# Patient Record
Sex: Male | Born: 1955 | Race: Black or African American | Hispanic: No | Marital: Single | Smoking: Never smoker
Health system: Southern US, Community
[De-identification: ages and names within clinical notes are randomized; demographics above are authoritative.]

## PROBLEM LIST (undated history)

## (undated) DIAGNOSIS — I10 Essential (primary) hypertension: Secondary | ICD-10-CM

## (undated) HISTORY — PX: COLONOSCOPY: SHX174

---

## 1995-04-16 HISTORY — PX: LUNG SURGERY: SHX703

## 2000-08-04 ENCOUNTER — Encounter: Payer: Self-pay | Admitting: Surgery

## 2000-08-04 ENCOUNTER — Ambulatory Visit (HOSPITAL_COMMUNITY): Admission: RE | Admit: 2000-08-04 | Discharge: 2000-08-04 | Payer: Self-pay | Admitting: Surgery

## 2001-02-10 ENCOUNTER — Ambulatory Visit (HOSPITAL_COMMUNITY): Admission: RE | Admit: 2001-02-10 | Discharge: 2001-02-10 | Payer: Self-pay | Admitting: Surgery

## 2001-02-10 ENCOUNTER — Encounter: Payer: Self-pay | Admitting: Surgery

## 2002-02-25 ENCOUNTER — Encounter: Payer: Self-pay | Admitting: Surgery

## 2002-02-25 ENCOUNTER — Ambulatory Visit (HOSPITAL_COMMUNITY): Admission: RE | Admit: 2002-02-25 | Discharge: 2002-02-25 | Payer: Self-pay | Admitting: Surgery

## 2003-03-02 ENCOUNTER — Ambulatory Visit (HOSPITAL_COMMUNITY): Admission: RE | Admit: 2003-03-02 | Discharge: 2003-03-02 | Payer: Self-pay | Admitting: Surgery

## 2004-03-22 ENCOUNTER — Ambulatory Visit (HOSPITAL_COMMUNITY): Admission: RE | Admit: 2004-03-22 | Discharge: 2004-03-22 | Payer: Self-pay | Admitting: General Surgery

## 2004-04-12 ENCOUNTER — Ambulatory Visit (HOSPITAL_COMMUNITY): Admission: RE | Admit: 2004-04-12 | Discharge: 2004-04-12 | Payer: Self-pay | Admitting: Surgery

## 2005-04-17 ENCOUNTER — Emergency Department (HOSPITAL_COMMUNITY): Admission: EM | Admit: 2005-04-17 | Discharge: 2005-04-17 | Payer: Self-pay | Admitting: Emergency Medicine

## 2007-08-10 IMAGING — CR DG CHEST 2V
2 series · 2 of 2 positions shown · non-contrast
Comparison: none

CLINICAL DATA: Cough for two days, pain on left side intermittently for approximately two weeks.  Hypertension, previous lung surgery 6228.    
 CHEST - 2 VIEW:
 PA and lateral views of the chest are made and are compared to previous studies of 04/12/2004 and show bilateral apical blebs and bullae with bilateral apical or right basilar scarring.  The left bullae now show thickening of the wall compared to the previous study with a questionable small air-fluid level within the most inferior of the left upper lobe bullae.  Heart and mediastinum are normal.  There is no evidence of edema.   No consolidation, pleural effusion or pneumothorax is seen. The bones appear to be within normal limits.

[view not recorded (1 of 2)]
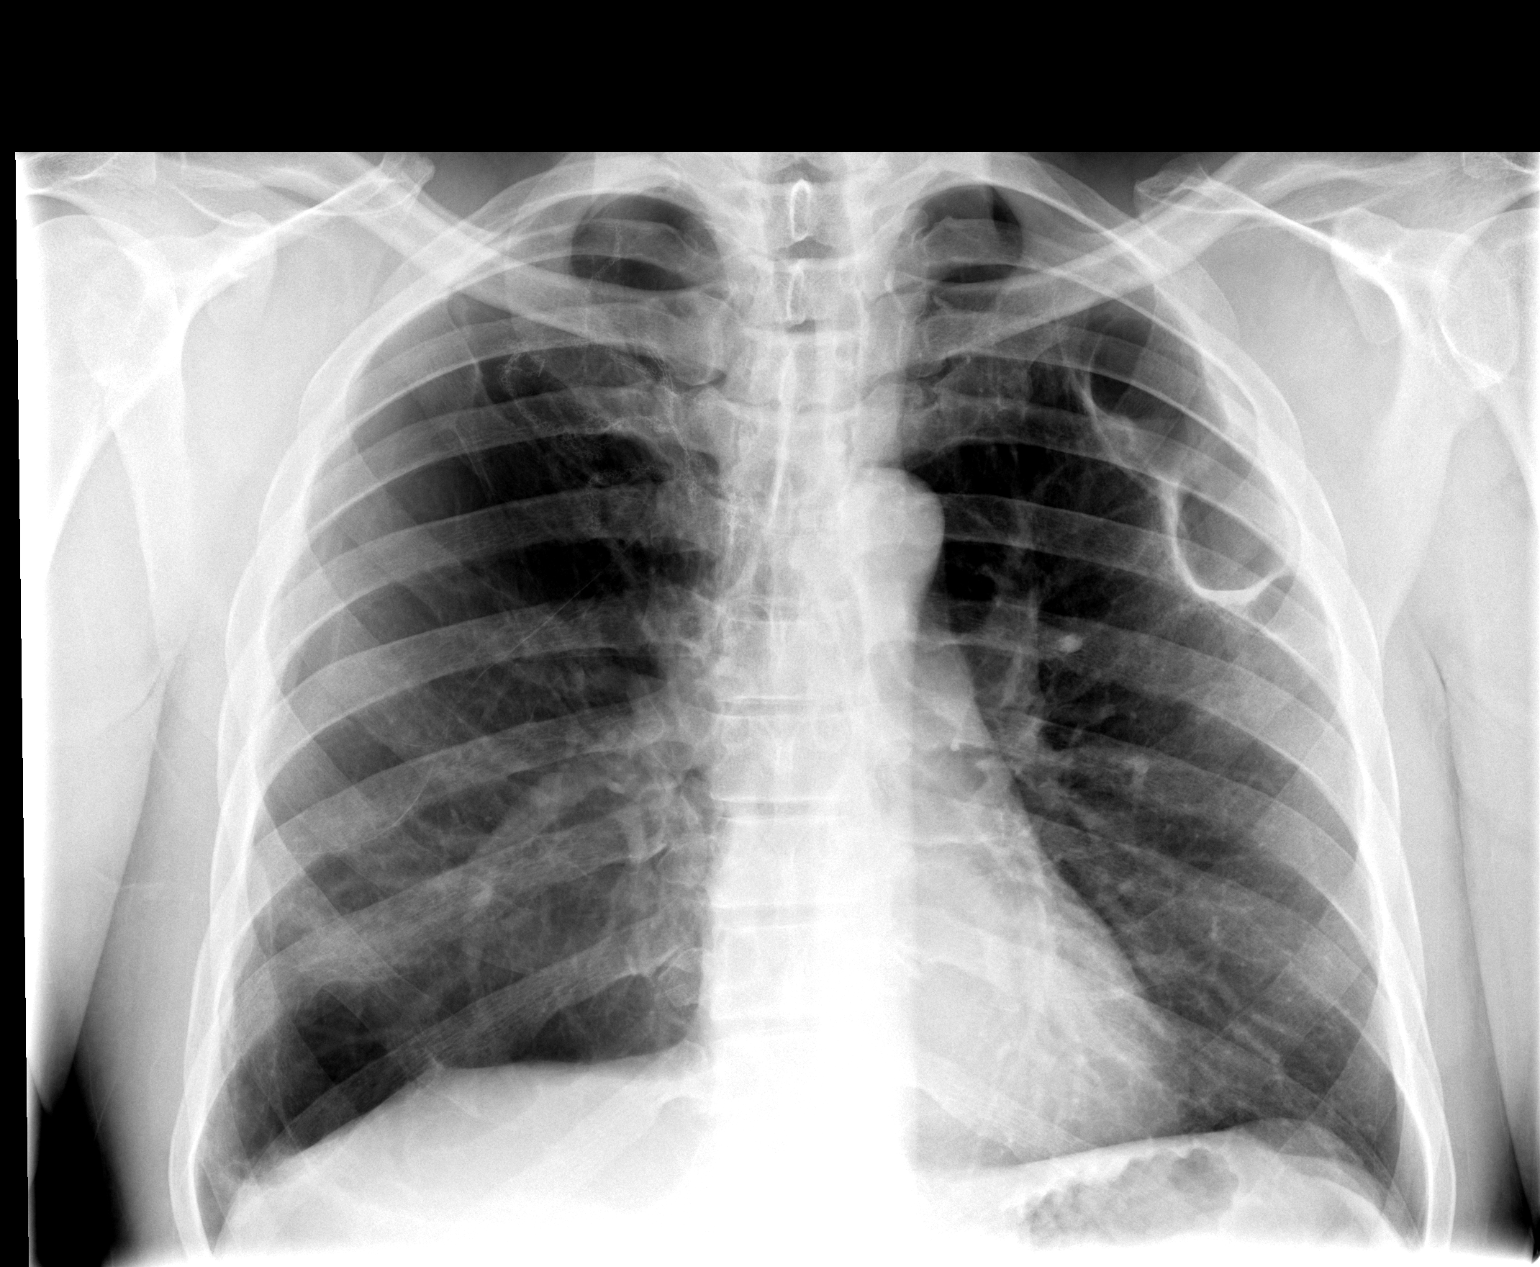

[view not recorded (2 of 2)]
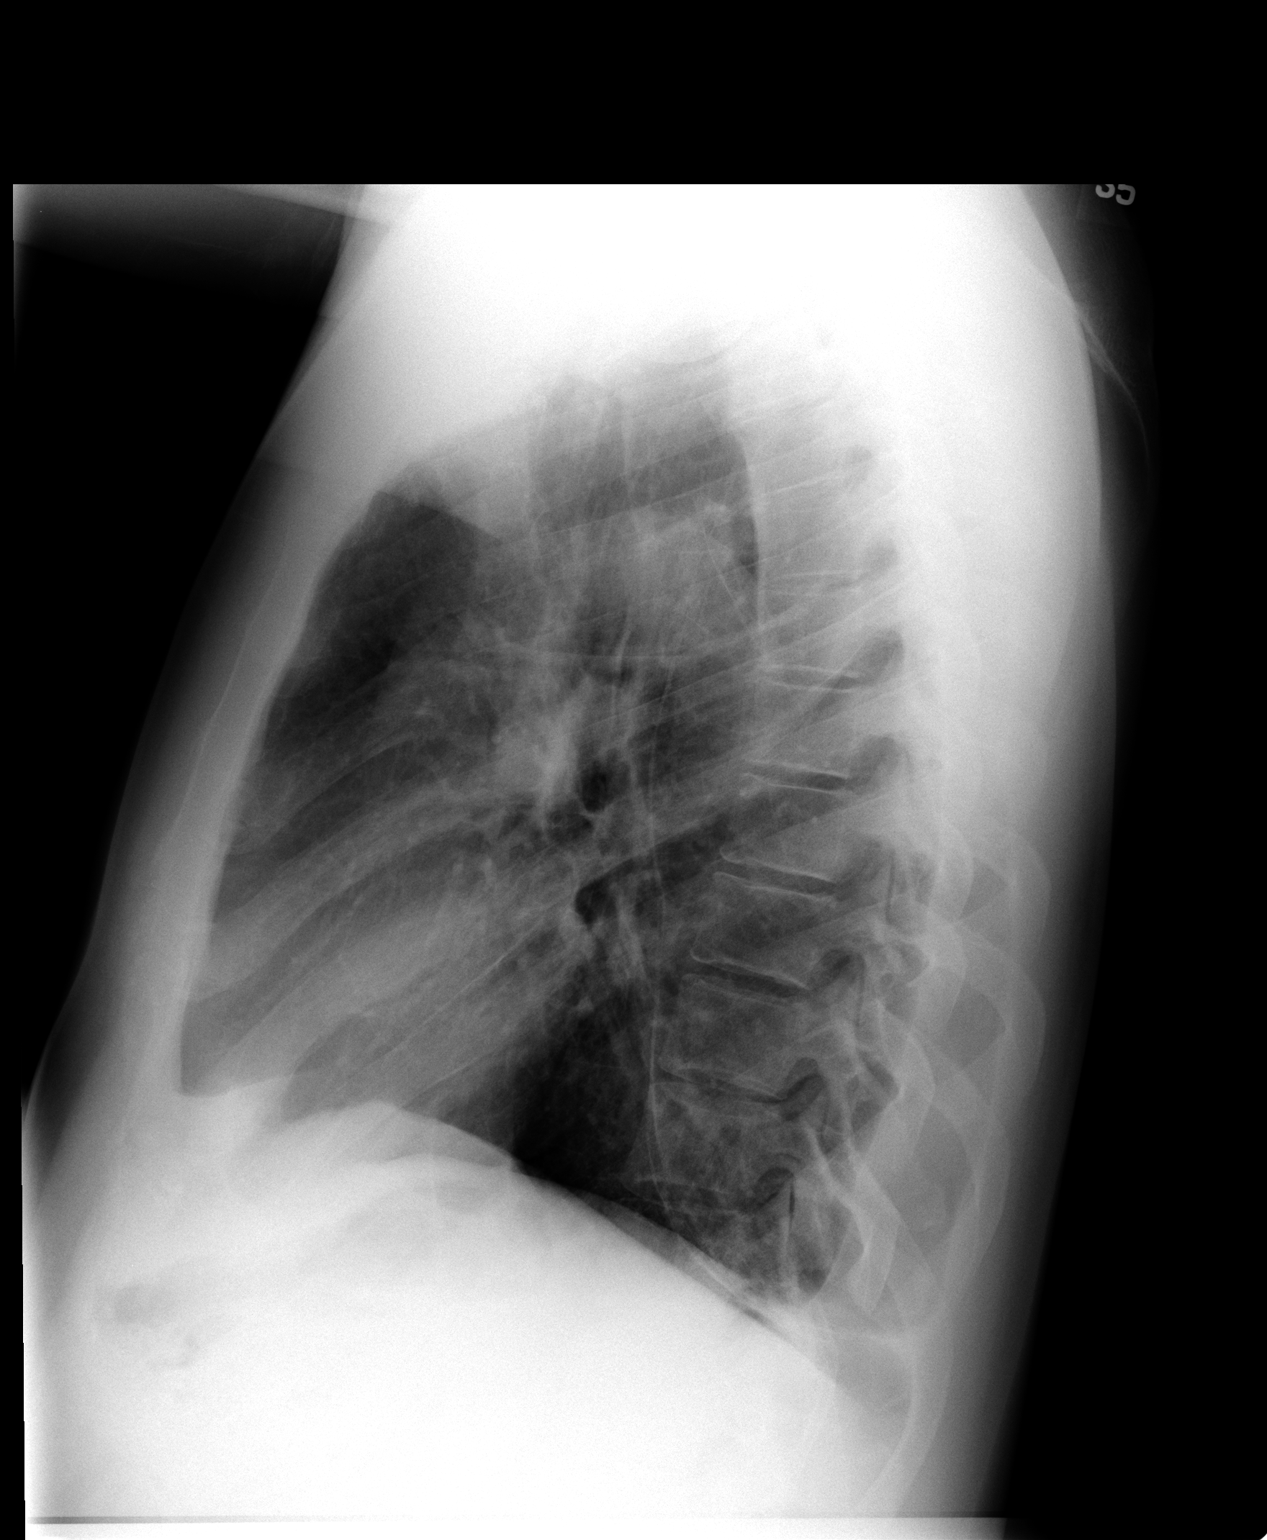

[2 of 2 positions shown; findings below may reference images not displayed]

IMPRESSION: Bilateral apical blebs and bullae with scarring in those areas and in the right lower lobe area.  Questionable small air-fluid level left inferior lateral upper lobe bullae with significant thickening of the wall of the left upper lobe bullae compared to previous study.  The possibility of acute inflammation in that area must be considered clinically.

## 2013-12-06 ENCOUNTER — Telehealth: Payer: Self-pay

## 2013-12-06 ENCOUNTER — Other Ambulatory Visit: Payer: Self-pay

## 2013-12-06 DIAGNOSIS — Z1211 Encounter for screening for malignant neoplasm of colon: Secondary | ICD-10-CM

## 2013-12-06 NOTE — Telephone Encounter (Signed)
Gastroenterology Pre-Procedure Review  Request Date: 12/06/2013 Requesting Physician: Dr. Dwana Melena  PATIENT REVIEW QUESTIONS: The patient responded to the following health history questions as indicated:    1. Diabetes Melitis: no 2. Joint replacements in the past 12 months: no 3. Major health problems in the past 3 months: no 4. Has an artificial valve or MVP: no 5. Has a defibrillator: no 6. Has been advised in past to take antibiotics in advance of a procedure like teeth cleaning: no    MEDICATIONS & ALLERGIES:    Patient reports the following regarding taking any blood thinners:   Plavix? no Aspirin? YES Coumadin? no  Patient confirms/reports the following medications:  Current Outpatient Prescriptions  Medication Sig Dispense Refill  . aspirin 81 MG tablet Take 81 mg by mouth daily.      Marland Kitchen dutasteride (AVODART) 0.5 MG capsule Take 0.5 mg by mouth daily.      . simvastatin (ZOCOR) 40 MG tablet Take 40 mg by mouth daily.      . tamsulosin (FLOMAX) 0.4 MG CAPS capsule Take 0.4 mg by mouth.      . triamterene-hydrochlorothiazide (DYAZIDE) 37.5-25 MG per capsule Take 1 capsule by mouth daily.       No current facility-administered medications for this visit.    Patient confirms/reports the following allergies:  No Known Allergies  No orders of the defined types were placed in this encounter.    AUTHORIZATION INFORMATION Primary Insurance:   ID #:   Group #:  Pre-Cert / Auth required:  Pre-Cert / Auth #:   Secondary Insurance:   ID #:   Group #:  Pre-Cert / Auth required: Pre-Cert / Auth #:   SCHEDULE INFORMATION: Procedure has been scheduled as follows:  Date: 12/29/2013                   Time:  10:30 Am Location: [facility] Short Stay  This Gastroenterology Pre-Precedure Review Form is being routed to the following provider(s): R. Roetta Sessions, MD

## 2013-12-08 NOTE — Telephone Encounter (Signed)
OK to schedule

## 2013-12-09 MED ORDER — PEG-KCL-NACL-NASULF-NA ASC-C 100 G PO SOLR: 1.0000 | ORAL | Status: DC

## 2013-12-09 NOTE — Telephone Encounter (Signed)
Rx sent to the pharmacy and instructions mailed to pt.  

## 2013-12-15 ENCOUNTER — Encounter (HOSPITAL_COMMUNITY): Payer: Self-pay | Admitting: Pharmacy Technician

## 2013-12-29 ENCOUNTER — Encounter (HOSPITAL_COMMUNITY)
Admission: RE | Disposition: A | Discharge status: Home / Self Care | Payer: Self-pay | Source: Ambulatory Visit | Attending: Internal Medicine

## 2013-12-29 ENCOUNTER — Encounter (HOSPITAL_COMMUNITY): Payer: Self-pay

## 2013-12-29 ENCOUNTER — Ambulatory Visit (HOSPITAL_COMMUNITY)
Admission: RE | Admit: 2013-12-29 | Discharge: 2013-12-29 | Disposition: A | Discharge status: Home / Self Care | Payer: BC Managed Care – PPO | Source: Ambulatory Visit | Attending: Internal Medicine | Admitting: Internal Medicine

## 2013-12-29 DIAGNOSIS — Z1211 Encounter for screening for malignant neoplasm of colon: Secondary | ICD-10-CM | POA: Diagnosis present

## 2013-12-29 DIAGNOSIS — K5730 Diverticulosis of large intestine without perforation or abscess without bleeding: Secondary | ICD-10-CM | POA: Diagnosis not present

## 2013-12-29 DIAGNOSIS — I10 Essential (primary) hypertension: Secondary | ICD-10-CM | POA: Insufficient documentation

## 2013-12-29 DIAGNOSIS — Z7982 Long term (current) use of aspirin: Secondary | ICD-10-CM | POA: Diagnosis not present

## 2013-12-29 DIAGNOSIS — Z79899 Other long term (current) drug therapy: Secondary | ICD-10-CM | POA: Insufficient documentation

## 2013-12-29 DIAGNOSIS — K5731 Diverticulosis of large intestine without perforation or abscess with bleeding: Secondary | ICD-10-CM

## 2013-12-29 HISTORY — PX: COLONOSCOPY: SHX5424

## 2013-12-29 HISTORY — DX: Essential (primary) hypertension: I10

## 2013-12-29 SURGERY — COLONOSCOPY
Anesthesia: Moderate Sedation

## 2013-12-29 MED ORDER — MEPERIDINE HCL 100 MG/ML IJ SOLN
INTRAMUSCULAR | Status: DC | PRN
  Administered 2013-12-29: 25 mg via INTRAVENOUS
  Administered 2013-12-29: 50 mg via INTRAVENOUS

## 2013-12-29 MED ORDER — STERILE WATER FOR IRRIGATION IR SOLN
Status: DC | PRN
  Administered 2013-12-29: 11:00:00

## 2013-12-29 MED ORDER — MEPERIDINE HCL 100 MG/ML IJ SOLN
INTRAMUSCULAR | Status: AC
  Filled 2013-12-29: qty 2

## 2013-12-29 MED ORDER — SODIUM CHLORIDE 0.9 % IV SOLN
INTRAVENOUS | Status: DC
  Administered 2013-12-29: 10:00:00 via INTRAVENOUS

## 2013-12-29 MED ORDER — MIDAZOLAM HCL 5 MG/5ML IJ SOLN
INTRAMUSCULAR | Status: AC
  Filled 2013-12-29: qty 10

## 2013-12-29 MED ORDER — ONDANSETRON HCL 4 MG/2ML IJ SOLN
INTRAMUSCULAR | Status: AC
  Filled 2013-12-29: qty 2

## 2013-12-29 MED ORDER — MIDAZOLAM HCL 5 MG/5ML IJ SOLN
INTRAMUSCULAR | Status: DC | PRN
  Administered 2013-12-29: 1 mg via INTRAVENOUS
  Administered 2013-12-29 (×2): 2 mg via INTRAVENOUS

## 2013-12-29 MED ORDER — ONDANSETRON HCL 4 MG/2ML IJ SOLN
INTRAMUSCULAR | Status: DC | PRN
  Administered 2013-12-29: 4 mg via INTRAVENOUS

## 2013-12-29 NOTE — H&P (Signed)
 @   Primary Care Physician:  Catalina Pizza, MD Primary Gastroenterologist:  Dr. Jena Gauss  Pre-Procedure History & Physical: HPI:  Bryce Mcdaniel is a 58 y.o. male is here for a screening colonoscopy. Negative Colonoscopy by Dr. Katrinka Blazing 10 Years Ago. No Bowel Symptoms. No family History of Colon Cancer  Past Medical History  Diagnosis Date  . Hypertension     Past Surgical History  Procedure Laterality Date  . Lung surgery Right 1997    Dr Tanda Rockers [facility]  . Colonoscopy  10 years    Prior to Admission medications   Medication Sig Start Date End Date Taking? Authorizing Provider  aspirin 81 MG tablet Take 81 mg by mouth daily.   Yes Historical Provider, MD  dutasteride (AVODART) 0.5 MG capsule Take 0.5 mg by mouth daily.   Yes Historical Provider, MD  simvastatin (ZOCOR) 40 MG tablet Take 40 mg by mouth daily.   Yes Historical Provider, MD  tamsulosin (FLOMAX) 0.4 MG CAPS capsule Take 0.4 mg by mouth.   Yes Historical Provider, MD  triamterene-hydrochlorothiazide (DYAZIDE) 37.5-25 MG per capsule Take 1 capsule by mouth daily.   Yes Historical Provider, MD    Allergies as of 12/06/2013  . (No Known Allergies)    History reviewed. No pertinent family history.  History   Social History  . Marital Status: Single    Spouse Name: N/A    Number of Children: N/A  . Years of Education: N/A   Occupational History  . Not on file.   Social History Main Topics  . Smoking status: Never Smoker   . Smokeless tobacco: Not on file  . Alcohol Use: Yes  . Drug Use: No  . Sexual Activity: Not on file   Other Topics Concern  . Not on file   Social History Narrative  . No narrative on file    Review of Systems: See HPI, otherwise negative ROS  Physical Exam: BP 129/83  Pulse 72  Temp(Src) 98.1 F (36.7 C) (Oral)  Resp 16  Ht  (1.778 m)  Wt 217 lb (98.431 kg)  BMI 31.14 kg/m2  SpO2 98% General:   Alert,  Well-developed, well-nourished, pleasant and cooperative in  NAD Head:  Normocephalic and atraumatic. Eyes:  Sclera clear, no icterus.   Conjunctiva pink. Ears:  Normal auditory acuity. Nose:  No deformity, discharge,  or lesions. Mouth:  No deformity or lesions, dentition normal. Neck:  Supple; no masses or thyromegaly. Lungs:  Clear throughout to auscultation.   No wheezes, crackles, or rhonchi. No acute distress. Heart:  Regular rate and rhythm; no murmurs, clicks, rubs,  or gallops. Abdomen:  Soft, nontender and nondistended. No masses, hepatosplenomegaly or hernias noted. Normal bowel sounds, without guarding, and without rebound.   Msk:  Symmetrical without gross deformities. Normal posture. Pulses:  Normal pulses noted. Extremities:  Without clubbing or edema. Neurologic:  Alert and  oriented x4;  grossly normal neurologically. Skin:  Intact without significant lesions or rashes. Cervical Nodes:  No significant cervical adenopathy. Psych:  Alert and cooperative. Normal mood and affect.  Impression/Plan: Bryce Mcdaniel is now here to undergo a screening colonoscopy.  Average risk screening examination.  Risks, benefits, limitations, imponderables and alternatives regarding colonoscopy have been reviewed with the patient. Questions have been answered. All parties agreeable.     Notice:  This dictation was prepared with Dragon dictation along with smaller phrase technology. Any transcriptional errors that result from this process are unintentional and may not be corrected  upon review.

## 2013-12-29 NOTE — Discharge Instructions (Addendum)
°Colonoscopy °Discharge Instructions ° °Read the instructions outlined below and refer to this sheet in the next few weeks. These discharge instructions provide you with general information on caring for yourself after you leave the hospital. Your doctor may also give you specific instructions. While your treatment has been planned according to the most current medical practices available, unavoidable complications occasionally occur. If you have any problems or questions after discharge, call Dr. Rourk at [phone]. °ACTIVITY °· You may resume your regular activity, but move at a slower pace for the next 24 hours.  °· Take frequent rest periods for the next 24 hours.  °· Walking will help get rid of the air and reduce the bloated feeling in your belly (abdomen).  °· No driving for 24 hours (because of the medicine (anesthesia) used during the test).   °· Do not sign any important legal documents or operate any machinery for 24 hours (because of the anesthesia used during the test).  °NUTRITION °· Drink plenty of fluids.  °· You may resume your normal diet as instructed by your doctor.  °· Begin with a light meal and progress to your normal diet. Heavy or fried foods are harder to digest and may make you feel sick to your stomach (nauseated).  °· Avoid alcoholic beverages for 24 hours or as instructed.  °MEDICATIONS °· You may resume your normal medications unless your doctor tells you otherwise.  °WHAT YOU CAN EXPECT TODAY °· Some feelings of bloating in the abdomen.  °· Passage of more gas than usual.  °· Spotting of blood in your stool or on the toilet paper.  °IF YOU HAD POLYPS REMOVED DURING THE COLONOSCOPY: °· No aspirin products for 7 days or as instructed.  °· No alcohol for 7 days or as instructed.  °· Eat a soft diet for the next 24 hours.  °FINDING OUT THE RESULTS OF YOUR TEST °Not all test results are available during your visit. If your test results are not back during the visit, make an appointment  with your caregiver to find out the results. Do not assume everything is normal if you have not heard from your caregiver or the medical facility. It is important for you to follow up on all of your test results.  °SEEK IMMEDIATE MEDICAL ATTENTION IF: °· You have more than a spotting of blood in your stool.  °· Your belly is swollen (abdominal distention).  °· You are nauseated or vomiting.  °· You have a temperature over 101.  °· You have abdominal pain or discomfort that is severe or gets worse throughout the day.  ° ° ° ° °Diverticulosis information provided ° °Recommend one more screening colonoscopy in 10 years ° °Diverticulosis °Diverticulosis is the condition that develops when small pouches (diverticula) form in the wall of your colon. Your colon, or large intestine, is where water is absorbed and stool is formed. The pouches form when the inside layer of your colon pushes through weak spots in the outer layers of your colon. °CAUSES  °No one knows exactly what causes diverticulosis. °RISK FACTORS °· Being older than 50. Your risk for this condition increases with age. Diverticulosis is rare in people younger than 40 years. By age 80, almost everyone has it. °· Eating a low-fiber diet. °· Being frequently constipated. °· Being overweight. °· Not getting enough exercise. °· Smoking. °· Taking over-the-counter pain medicines, like aspirin and ibuprofen. °SYMPTOMS  °Most people with diverticulosis do not have symptoms. °DIAGNOSIS  °Because diverticulosis   often has no symptoms, health care providers often discover the condition during an exam for other colon problems. In many cases, a health care provider will diagnose diverticulosis while using a flexible scope to examine the colon (colonoscopy). °TREATMENT  °If you have never developed an infection related to diverticulosis, you may not need treatment. If you have had an infection before, treatment may include: °· Eating more fruits, vegetables, and  grains. °· Taking a fiber supplement. °· Taking a live bacteria supplement (probiotic). °· Taking medicine to relax your colon. °HOME CARE INSTRUCTIONS  °· Drink at least 6-8 glasses of water each day to prevent constipation. °· Try not to strain when you have a bowel movement. °· Keep all follow-up appointments. °If you have had an infection before:  °· Increase the fiber in your diet as directed by your health care provider or dietitian. °· Take a dietary fiber supplement if your health care provider approves. °· Only take medicines as directed by your health care provider. °SEEK MEDICAL CARE IF:  °· You have abdominal pain. °· You have bloating. °· You have cramps. °· You have not gone to the bathroom in 3 days. °SEEK IMMEDIATE MEDICAL CARE IF:  °· Your pain gets worse. °· Your bloating becomes very bad. °· You have a fever or chills, and your symptoms suddenly get worse. °· You begin vomiting. °· You have bowel movements that are bloody or black. °MAKE SURE YOU: °· Understand these instructions. °· Will watch your condition. °· Will get help right away if you are not doing well or get worse. °Document Released: 12/28/2003 Document Revised: 04/06/2013 Document Reviewed: 02/24/2013 °[employer]® Patient Information ©2015 [employer]. This information is not intended to replace advice given to you by your health care provider. Make sure you discuss any questions you have with your health care provider. ° °

## 2013-12-29 NOTE — Op Note (Signed)
[facility] [address] [place], [address]   COLONOSCOPY PROCEDURE REPORT  PATIENT: Bryce Mcdaniel, Bryce Mcdaniel  MR#:         [id number] BIRTHDATE: 01/05/1956 , 58  yrs. old GENDER: Male ENDOSCOPIST: R.  Roetta Sessions, MD FACP FACG REFERRED BY:  Catalina Pizza, M.D. PROCEDURE DATE:  12/29/2013 PROCEDURE:     Colonoscopy-screening  INDICATIONS: Average risk colorectal cancer screening examination  INFORMED CONSENT:  The risks, benefits, alternatives and imponderables including but not limited to bleeding, perforation as well as the possibility of a missed lesion have been reviewed.  The potential for biopsy, lesion removal, etc. have also been discussed.  Questions have been answered.  All parties agreeable. Please see the history and physical in the medical record for more information.  MEDICATIONS: Versed 5 mg IV and Demerol 75 mg IV in divided doses. Zofran 4 mg IV.  DESCRIPTION OF PROCEDURE:  After a digital rectal exam was performed, the EC-[id number] ([id number])  colonoscope was advanced from the anus through the rectum and colon to the area of the cecum, ileocecal valve and appendiceal orifice.  The cecum was deeply intubated.  These structures were well-seen and photographed for the record.  From the level of the cecum and ileocecal valve, the scope was slowly and cautiously withdrawn.  The mucosal surfaces were carefully surveyed utilizing scope tip deflection to facilitate fold flattening as needed.  The scope was pulled down into the rectum where a thorough examination including retroflexion was performed.    FINDINGS:  Adequate preparation. Normal rectum. Scattered left-sided diverticula; the remainder of the colonic mucosa appeared normal.  THERAPEUTIC / DIAGNOSTIC MANEUVERS PERFORMED:  none  COMPLICATIONS: none  CECAL WITHDRAWAL TIME:  7 minute  IMPRESSION:  Colonic diverticulosis  RECOMMENDATIONS: One more screening colonoscopy in 10  years   _______________________________ eSigned:  R. Roetta Sessions, MD FACP [facility] 12/29/2013 11:12 AM   CC:

## 2013-12-31 ENCOUNTER — Encounter (HOSPITAL_COMMUNITY): Payer: Self-pay | Admitting: Internal Medicine

## 2015-12-13 ENCOUNTER — Ambulatory Visit (INDEPENDENT_AMBULATORY_CARE_PROVIDER_SITE_OTHER): Payer: BLUE CROSS/BLUE SHIELD | Admitting: Physician Assistant

## 2015-12-13 VITALS — BP 140/78 | HR 67 | Temp 98.6°F | Resp 16 | Ht 70.0 in | Wt 222.6 lb

## 2015-12-13 DIAGNOSIS — M545 Low back pain: Secondary | ICD-10-CM | POA: Diagnosis not present

## 2015-12-13 DIAGNOSIS — M62838 Other muscle spasm: Secondary | ICD-10-CM | POA: Diagnosis not present

## 2015-12-13 MED ORDER — CYCLOBENZAPRINE HCL 5 MG PO TABS: 5.0000 mg | ORAL_TABLET | Freq: Three times a day (TID) | ORAL | 0 refills | Status: AC | PRN

## 2015-12-13 MED ORDER — NAPROXEN 500 MG PO TABS: 500.0000 mg | ORAL_TABLET | Freq: Two times a day (BID) | ORAL | 0 refills | Status: AC

## 2015-12-13 NOTE — Progress Notes (Signed)
Bryce HammedMarcus B Learn  MRN: [id number] DOB: 1956-03-30  Subjective:  Bryce Mcdaniel is a [age] y.o. male seen in office today for a chief complaint of left lower back pain x 2 weeks. 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 Later that day he felt a pain in his left lower back similar to the times he has pulled a muscle. Has associated intermittent  left thigh pain. Denies numbness, tingling, bowel incontinence, and loss of sensation. Notes the leg pain developed immediately after back pain. He has tried wearing a back brace with moderate relief of left back and leg pain. Has also tried intermittent bengay and tylenol with moderate relief. Notes the pain has gotten much better since the initial event but is still present with certain movements.   Review of Systems  Musculoskeletal: Negative for gait problem, joint swelling and myalgias.  Neurological: Negative for dizziness and weakness.    There are no active problems to display for this patient.   Current Outpatient Prescriptions on File Prior to Visit  Medication Sig Dispense Refill  . aspirin 81 MG tablet Take 81 mg by mouth daily.    Marland Kitchen. dutasteride (AVODART) 0.5 MG capsule Take 0.5 mg by mouth daily.    . simvastatin (ZOCOR) 40 MG tablet Take 40 mg by mouth daily.    . tamsulosin (FLOMAX) 0.4 MG CAPS capsule Take 0.4 mg by mouth.    . triamterene-hydrochlorothiazide (DYAZIDE) 37.5-25 MG per capsule Take 1 capsule by mouth daily.     No current facility-administered medications on file prior to visit.     No Known Allergies  Social History   Social History  . Marital status: Single    Spouse name: N/A  . Number of children: N/A  . Years of education: N/A   Occupational History  . Not on file.   Social History Main Topics  . Smoking status: Never Smoker  . Smokeless tobacco: Never Used  . Alcohol use Yes  . Drug use: No  . Sexual activity: Not on file   Other Topics Concern    . Not on file   Social History Narrative  . No narrative on file    Objective:  BP 140/78 (BP Location: Right Arm, Patient Position: Sitting, Cuff Size: Normal)   Pulse 67   Temp 98.6 F (37 C)   Resp 16   Ht 5\' 10"  (1.778 m)   Wt 222 lb 9.6 oz (101 kg)   SpO2 97%   BMI 31.94 kg/m   Physical Exam  Constitutional: He is oriented to person, place, and time and well-developed, well-nourished, and in no distress.  HENT:  Head: Normocephalic and atraumatic.  Eyes: Conjunctivae are normal.  Neck: Normal range of motion.  Pulmonary/Chest: Effort normal.  Musculoskeletal:       Lumbar back: He exhibits tenderness. He exhibits normal range of motion, no bony tenderness and no swelling.       Back:       Left upper leg: He exhibits no tenderness, no bony tenderness and no swelling.  Neurological: He is alert and oriented to person, place, and time. He has normal sensation and normal strength. Gait normal.  Skin: Skin is warm and dry.  Psychiatric: Affect normal.  Vitals reviewed.   Assessment and Plan :   1. Left low back pain, with sciatica presence unspecified -Likely muscle sprain - naproxen (NAPROSYN) 500 MG tablet; Take 1 tablet (  500 mg total) by mouth 2 (two) times daily with a meal.  Dispense: 30 tablet; Refill: 0 -Heat to affected area 4-5 x day 30 min   2. Muscle spasm of left lower extremity -Pt history consistent with muscle spasm of left thigh -Encouraged to increase water intake to 64 oz a day - cyclobenzaprine (FLEXERIL) 5 MG tablet; Take 1 tablet (5 mg total) by mouth 3 (three) times daily as needed for muscle spasms.  Dispense: 60 tablet; Refill: 0  -Return in 7 days if no improvement for further evaluaion  Benjiman Core PA-C  [facility] 12/13/2015 4:13 PM

## 2015-12-13 NOTE — Patient Instructions (Addendum)
Take naproxen twice daily with food for one week for inflammation Take flexeril up to three times a day as needed for spasm Return here in 7 days if no improvement   Just to know, flexeril can cause side effects that may impair your thinking or reactions. Be careful if you drive or do anything that requires you to be awake and alert. void drinking alcohol, which can increase some of the side effects of Flexeril.  NSAIDs like naproxen have common side effects of heartburn, stomach pain, indigestion, and headache. Could lead to renal insufficiency, stroke, or GI bleed if taken excess amounts outside of what is recommended on label long term.    You should avoid heavy lifting or strenuous repetitive activity to prevent recurrence of event. Experiment with both ice and heat and choose whichever feels best for you.  Use heat pad or ice pack, do not apply directly to skin, use barrier such as towel over the skin. Leave on for 15-20 minutes, 3-4 times a day.  Please perform exercises below once your pain goes away. Stretches are to be performed for 2 sets, holding 10-15 seconds each. Recommended to perform this rehab twice daily within pain tolerance for 2 weeks.   FLEXION RANGE OF MOTION AND STRETCHING EXERCISES: STRETCH - Flexion, Single Knee to Chest   Lie on a firm bed or floor with both legs extended in front of you.  Keeping one leg in contact with the floor, bring your opposite knee to your chest. Hold your leg in place by either grabbing behind your thigh or at your knee.  Pull until you feel a gentle stretch in your lower back.   Slowly release your grasp and repeat the exercise with the opposite side.  STRETCH - Flexion, Double Knee to Chest   Lie on a firm bed or floor with both legs extended in front of you.  Keeping one leg in contact with the floor, bring your opposite knee to your chest.  Tense your stomach muscles to support your back and then lift your other knee to your  chest. Hold your legs in place by either grabbing behind your thighs or at your knees.  Pull both knees toward your chest until you feel a gentle stretch in your lower back.   Tense your stomach muscles and slowly return one leg at a time to the floor.  STRETCH - Low Trunk Rotation  Lie on a firm bed or floor. Keeping your legs in front of you, bend your knees so they are both pointed toward the ceiling and your feet are flat on the floor.  Extend your arms out to the side. This will stabilize your upper body by keeping your shoulders in contact with the floor.  Gently and slowly drop both knees together to one side until you feel a gentle stretch in your lower back.   Tense your stomach muscles to support your lower back as you bring your knees back to the starting position. Repeat the exercise to the other side.   EXTENSION RANGE OF MOTION AND FLEXIBILITY EXERCISES: STRETCH - Extension, Prone on Elbows   Lie on your stomach on the floor, a bed will be too soft. Place your palms about shoulder width apart and at the height of your head.  Place your elbows under your shoulders. If this is too painful, stack pillows under your chest.  Allow your body to relax so that your hips drop lower and make contact more completely with the  floor.  Slowly return to lying flat on the floor.  RANGE OF MOTION - Extension, Prone Press Ups  Lie on your stomach on the floor, a bed will be too soft. Place your palms about shoulder width apart and at the height of your head.  Keeping your back as relaxed as possible, slowly straighten your elbows while keeping your hips on the floor. You may adjust the placement of your hands to maximize your comfort. As you gain motion, your hands will come more underneath your shoulders.  Slowly return to lying flat on the floor.  RANGE OF MOTION- Quadruped, Neutral Spine   Assume a hands and knees position on a firm surface. Keep your hands under your shoulders  and your knees under your hips. You may place padding under your knees for comfort.  Drop your head and point your tail bone toward the ground below you. This will round out your lower back like an angry cat.    Slowly lift your head and release your tail bone so that your back sags into a large arch, like an old horse.  Repeat this until you feel limber in your lower back.  Now, find your "sweet spot." This will be the most comfortable position somewhere between the two previous positions. This is your neutral spine. Once you have found this position, tense your stomach muscles to support your lower back.  STRENGTHENING EXERCISES - Low Back Strain These exercises may help you when beginning to rehabilitate your injury. These exercises should be done near your "sweet spot." This is the neutral, low-back arch, somewhere between fully rounded and fully arched, that is your least painful position. When performed in this safe range of motion, these exercises can be used for people who have either a flexion or extension based injury. These exercises may resolve your symptoms with or without further involvement from your physician, physical therapist or athletic trainer. While completing these exercises, remember:   Muscles can gain both the endurance and the strength needed for everyday activities through controlled exercises.  Complete these exercises as instructed by your physician, physical therapist or athletic trainer. Increase the resistance and repetitions only as guided.  You may experience muscle soreness or fatigue, but the pain or discomfort you are trying to eliminate should never worsen during these exercises. If this pain does worsen, stop and make certain you are following the directions exactly. If the pain is still present after adjustments, discontinue the exercise until you can discuss the trouble with your caregiver.  STRENGTHENING - Deep Abdominals, Pelvic Tilt  Lie on a firm bed  or floor. Keeping your legs in front of you, bend your knees so they are both pointed toward the ceiling and your feet are flat on the floor.  Tense your lower abdominal muscles to press your lower back into the floor. This motion will rotate your pelvis so that your tail bone is scooping upwards rather than pointing at your feet or into the floor.  STRENGTHENING - Abdominals, Crunches   Lie on a firm bed or floor. Keeping your legs in front of you, bend your knees so they are both pointed toward the ceiling and your feet are flat on the floor. Cross your arms over your chest.  Slightly tip your chin down without bending your neck.  Tense your abdominals and slowly lift your trunk high enough to just clear your shoulder blades. Lifting higher can put excessive stress on the lower back and does not further  strengthen your abdominal muscles.  Control your return to the starting position.  STRENGTHENING - Quadruped, Opposite UE/LE Lift   Assume a hands and knees position on a firm surface. Keep your hands under your shoulders and your knees under your hips. You may place padding under your knees for comfort.  Find your neutral spine and gently tense your abdominal muscles so that you can maintain this position. Your shoulders and hips should form a rectangle that is parallel with the floor and is not twisted.  Keeping your trunk steady, lift your right hand no higher than your shoulder and then your left leg no higher than your hip. Make sure you are not holding your breath.   Continuing to keep your abdominal muscles tense and your back steady, slowly return to your starting position. Repeat with the opposite arm and leg.  STRENGTHENING - Lower Abdominals, Double Knee Lift  Lie on a firm bed or floor. Keeping your legs in front of you, bend your knees so they are both pointed toward the ceiling and your feet are flat on the floor.  Tense your abdominal muscles to brace your lower back and  slowly lift both of your knees until they come over your hips. Be certain not to hold your breath.  POSTURE AND BODY MECHANICS CONSIDERATIONS - Low Back Strain Keeping correct posture when sitting, standing or completing your activities will reduce the stress put on different body tissues, allowing injured tissues a chance to heal and limiting painful experiences. The following are general guidelines for improved posture. Your physician or physical therapist will provide you with any instructions specific to your needs. While reading these guidelines, remember:  The exercises prescribed by your provider will help you have the flexibility and strength to maintain correct postures.  The correct posture provides the best environment for your joints to work. All of your joints have less wear and tear when properly supported by a spine with good posture. This means you will experience a healthier, less painful body.  Correct posture must be practiced with all of your activities, especially prolonged sitting and standing. Correct posture is as important when doing repetitive low-stress activities (typing) as it is when doing a single heavy-load activity (lifting). RESTING POSITIONS Consider which positions are most painful for you when choosing a resting position. If you have pain with flexion-based activities (sitting, bending, stooping, squatting), choose a position that allows you to rest in a less flexed posture. You would want to avoid curling into a fetal position on your side. If your pain worsens with extension-based activities (prolonged standing, working overhead), avoid resting in an extended position such as sleeping on your stomach. Most people will find more comfort when they rest with their spine in a more neutral position, neither too rounded nor too arched. Lying on a non-sagging bed on your side with a pillow between your knees, or on your back with a pillow under your knees will often provide  some relief. Keep in mind, being in any one position for a prolonged period of time, no matter how correct your posture, can still lead to stiffness. PROPER SITTING POSTURE In order to minimize stress and discomfort on your spine, you must sit with correct posture. Sitting with good posture should be effortless for a healthy body. Returning to good posture is a gradual process. Many people can work toward this most comfortably by using various supports until they have the flexibility and strength to maintain this posture on their  own. When sitting with proper posture, your ears will fall over your shoulders and your shoulders will fall over your hips. You should use the back of the chair to support your upper back. Your lower back will be in a neutral position, just slightly arched. You may place a small pillow or folded towel at the base of your lower back for support.  When working at a desk, create an environment that supports good, upright posture. Without extra support, muscles tire, which leads to excessive strain on joints and other tissues. Keep these recommendations in mind: CHAIR:  A chair should be able to slide under your desk when your back makes contact with the back of the chair. This allows you to work closely.  The chair's height should allow your eyes to be level with the upper part of your monitor and your hands to be slightly lower than your elbows. BODY POSITION  Your feet should make contact with the floor. If this is not possible, use a foot rest.  Keep your ears over your shoulders. This will reduce stress on your neck and lower back. INCORRECT SITTING POSTURES  If you are feeling tired and unable to assume a healthy sitting posture, do not slouch or slump. This puts excessive strain on your back tissues, causing more damage and pain. Healthier options include:  Using more support, like a lumbar pillow.  Switching tasks to something that requires you to be upright or  walking.  Talking a brief walk.  Lying down to rest in a neutral-spine position. PROLONGED STANDING WHILE SLIGHTLY LEANING FORWARD  When completing a task that requires you to lean forward while standing in one place for a long time, place either foot up on a stationary 2-4 inch high object to help maintain the best posture. When both feet are on the ground, the lower back tends to lose its slight inward curve. If this curve flattens (or becomes too large), then the back and your other joints will experience too much stress, tire more quickly, and can cause pain. CORRECT STANDING POSTURES Proper standing posture should be assumed with all daily activities, even if they only take a few moments, like when brushing your teeth. As in sitting, your ears should fall over your shoulders and your shoulders should fall over your hips. You should keep a slight tension in your abdominal muscles to brace your spine. Your tailbone should point down to the ground, not behind your body, resulting in an over-extended swayback posture.  INCORRECT STANDING POSTURES  Common incorrect standing postures include a forward head, locked knees and/or an excessive swayback. WALKING Walk with an upright posture. Your ears, shoulders and hips should all line-up. PROLONGED ACTIVITY IN A FLEXED POSITION When completing a task that requires you to bend forward at your waist or lean over a low surface, try to find a way to stabilize 3 out of 4 of your limbs. You can place a hand or elbow on your thigh or rest a knee on the surface you are reaching across. This will provide you more stability so that your muscles do not fatigue as quickly. By keeping your knees relaxed, or slightly bent, you will also reduce stress across your lower back. CORRECT LIFTING TECHNIQUES DO :   Assume a wide stance. This will provide you more stability and the opportunity to get as close as possible to the object which you are lifting.  Tense your  abdominals to brace your spine. Bend at the knees  and hips. Keeping your back locked in a neutral-spine position, lift using your leg muscles. Lift with your legs, keeping your back straight.  Test the weight of unknown objects before attempting to lift them.  Try to keep your elbows locked down at your sides in order get the best strength from your shoulders when carrying an object.  Always ask for help when lifting heavy or awkward objects. INCORRECT LIFTING TECHNIQUES DO NOT:   Lock your knees when lifting, even if it is a small object.  Bend and twist. Pivot at your feet or move your feet when needing to change directions.  Assume that you can safely pick up even a paper clip without proper posture.        IF you received an x-ray today, you will receive an invoice from [facility]. Please contact [facility] at [phone] with questions or concerns regarding your invoice.   IF you received labwork today, you will receive an invoice from [employer] [facility]. Please contact [facility] at [phone] with questions or concerns regarding your invoice.   Our billing staff will not be able to assist you with questions regarding bills from these companies.  You will be contacted with the lab results as soon as they are available. The fastest way to get your results is to activate your My Chart account. Instructions are located on the last page of this paperwork. If you have not heard from [place] regarding the results in 2 weeks, please contact this office.

## 2015-12-21 ENCOUNTER — Ambulatory Visit (INDEPENDENT_AMBULATORY_CARE_PROVIDER_SITE_OTHER): Payer: BLUE CROSS/BLUE SHIELD | Admitting: Physician Assistant

## 2015-12-21 VITALS — BP 124/72 | HR 60 | Temp 97.8°F | Resp 18 | Ht 70.0 in | Wt 218.2 lb

## 2015-12-21 DIAGNOSIS — M545 Low back pain, unspecified: Secondary | ICD-10-CM

## 2015-12-21 NOTE — Patient Instructions (Addendum)
Continue naproxen for pain. You can use OTC naproxen 220 twice a day and see if you get relief with this. It is okay to continue this for 4-6 weeks.   I want you to continue using heat when you have the pain as well.   Try taking the flexeril at night because I think this will help a lot with the spasm.   Continue stretching.   You can go back to work, but I would avoid heavy lifting until pain has fully subsided. Muscle Strain A muscle strain (pulled muscle) happens when a muscle is stretched beyond normal length. It happens when a sudden, violent force stretches your muscle too far. Usually, a few of the fibers in your muscle are torn. Muscle strain is common in athletes. Recovery usually takes 1-2 weeks. Complete healing takes 5-6 weeks.  HOME CARE   Follow the PRICE method of treatment to help your injury get better. Do this the first 2-3 days after the injury:  Protect. Protect the muscle to keep it from getting injured again.  Rest. Limit your activity and rest the injured body part.  Ice. Put ice in a plastic bag. Place a towel between your skin and the bag. Then, apply the ice and leave it on from 15-20 minutes each hour. After the third day, switch to moist heat packs.  Compression. Use a splint or elastic bandage on the injured area for comfort. Do not put it on too tightly.  Elevate. Keep the injured body part above the level of your heart.  Only take medicine as told by your doctor.  Warm up before doing exercise to prevent future muscle strains. GET HELP IF:   You have more pain or puffiness (swelling) in the injured area.  You feel numbness, tingling, or notice a loss of strength in the injured area. MAKE SURE YOU:   Understand these instructions.  Will watch your condition.  Will get help right away if you are not doing well or get worse.   This information is not intended to replace advice given to you by your health care provider. Make sure you discuss any  questions you have with your health care provider.   Document Released: 01/09/2008 Document Revised: 01/20/2013 Document Reviewed: 10/29/2012 Elsevier Interactive Patient Education 2016 [employer]    IF you received an x-ray today, you will receive an invoice from [facility]. Please contact [facility] at [phone] with questions or concerns regarding your invoice.   IF you received labwork today, you will receive an invoice from [facility]. Please contact [facility] at [phone] with questions or concerns regarding your invoice.   Our billing staff will not be able to assist you with questions regarding bills from these companies.  You will be contacted with the lab results as soon as they are available. The fastest way to get your results is to activate your My Chart account. Instructions are located on the last page of this paperwork. If you have not heard from us regarding the results in 2 weeks, please contact this office.

## 2015-12-21 NOTE — Progress Notes (Signed)
Bryce Mcdaniel  MRN: [id number] DOB: January 17, 1956  Subjective:  Bryce Mcdaniel is a 60 y.o. male seen for follow up on low back pain. Pt initally seen on 12/13/15 for low back strain. Given prescription for naproxen and flexeril and recommended heating pad and stretching.   Since previous visit, pt has been taking the naproxen twice daily and used the heating pad regularly. He also started the stretches I gave him four days after his last visit and did not experience pain with them. Yesterday was his first day to not take naproxen and he notes that he did have some pain in his back and leg. However, it has significantly improved since previous visit. At the initial visit, his pain was a 9/10, today it is a 4/10, and when he is taking the naproxen he notes it is a 0/10.   Pt did not use the flexeril prescribed at the previous visit because he was afraid of it making him too drowsy.   Pt works two jobs. 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 He has not been to the [employer] job this past week because he was informed at previous visit to avoid heavy lifting and that is all he does at his [employer] job. He is wondering when he can return to his [employer] job.    Review of Systems  Genitourinary: Negative for difficulty urinating and dysuria.  Musculoskeletal: Negative for arthralgias, gait problem, joint swelling and myalgias.  Neurological: Negative for weakness and numbness.    There are no active problems to display for this patient.   Current Outpatient Prescriptions on File Prior to Visit  Medication Sig Dispense Refill  . aspirin 81 MG tablet Take 81 mg by mouth daily.    . cyclobenzaprine (FLEXERIL) 5 MG tablet Take 1 tablet (5 mg total) by mouth 3 (three) times daily as needed for muscle spasms. 60 tablet 0  . dutasteride (AVODART) 0.5 MG capsule Take 0.5 mg by mouth daily.    . naproxen (NAPROSYN) 500 MG tablet Take 1 tablet (500 mg total) by mouth 2 (two)  times daily with a meal. 30 tablet 0  . simvastatin (ZOCOR) 40 MG tablet Take 40 mg by mouth daily.    . tamsulosin (FLOMAX) 0.4 MG CAPS capsule Take 0.4 mg by mouth.    . triamterene-hydrochlorothiazide (DYAZIDE) 37.5-25 MG per capsule Take 1 capsule by mouth daily.     No current facility-administered medications on file prior to visit.     No Known Allergies  Objective:  BP 124/72   Pulse 60   Temp 97.8 F (36.6 C) (Oral)   Resp 18   Ht 5\' 10"  (1.778 m)   Wt 218 lb 3.2 oz (99 kg)   SpO2 99%   BMI 31.31 kg/m   Physical Exam  Constitutional: He is oriented to person, place, and time and well-developed, well-nourished, and in no distress.  HENT:  Head: Normocephalic and atraumatic.  Eyes: Conjunctivae are normal.  Neck: Normal range of motion.  Pulmonary/Chest: Effort normal.  Musculoskeletal:       Right hip: He exhibits normal range of motion and normal strength.       Left hip: He exhibits normal range of motion and normal strength.       Lumbar back: He exhibits spasm (along palpation of left lower back muscle ). He exhibits normal range of motion and no bony tenderness.  Back:       Right upper leg: Normal.       Left upper leg: He exhibits no tenderness and no swelling.  Neurological: He is alert and oriented to person, place, and time. He has a normal Straight Leg Raise Test. Gait normal.  Skin: Skin is warm and dry.  Psychiatric: Affect normal.  Vitals reviewed.   Assessment and Plan :   1. Left-sided low back pain without sciatica -Likely muscle strain -Continue using naproxen daily at least for the next week. After this can use it prn. Instructed to buy OTC naproxen 220mg  and see if this keeps helps with pain.  -Continue heating pad. -Instructed to avoid heavy lifting until he is pain free for 24 hours without use of naproxen. He is aware that it can take up to 4-6 weeks for muscle strains to heal. -Also instructed to use flexeril prescribed at  previous visit for spasm.  -Pt to contact me via mychart once he is pain free so I can write a letter to his work.  -Return to clinic if symptoms worsen, do not improve, or as needed   Benjiman CoreBrittany Shann Merrick PA-C  [facility] 12/21/2015 12:04 PM

## 2019-04-30 ENCOUNTER — Ambulatory Visit: Payer: BC Managed Care – PPO | Attending: Internal Medicine

## 2019-04-30 ENCOUNTER — Other Ambulatory Visit: Payer: Self-pay

## 2019-04-30 DIAGNOSIS — Z20822 Contact with and (suspected) exposure to covid-19: Secondary | ICD-10-CM

## 2019-04-30 DIAGNOSIS — U071 COVID-19: Secondary | ICD-10-CM | POA: Insufficient documentation

## 2019-05-01 LAB — NOVEL CORONAVIRUS, NAA: SARS-CoV-2, NAA: DETECTED — AB

## 2019-05-03 ENCOUNTER — Telehealth: Payer: Self-pay | Admitting: Adult Health

## 2019-05-03 NOTE — Telephone Encounter (Signed)
Called and [facility] for patient to call us back about his COVID results.  Lillard Anes, NP

## 2019-08-14 DEATH — deceased

## 2019-12-30 ENCOUNTER — Ambulatory Visit: Payer: BC Managed Care – PPO | Attending: Internal Medicine

## 2019-12-30 DIAGNOSIS — Z23 Encounter for immunization: Secondary | ICD-10-CM

## 2019-12-30 NOTE — Progress Notes (Signed)
   Covid-19 Vaccination Clinic  Name:  Bryce Mcdaniel    MRN: [id number] DOB: 09-10-1955  12/30/2019  [name] was observed post Covid-19 immunization for 15 minutes without incident. He was provided with Vaccine Information Sheet and instruction to access the V-Safe system.   [name] was instructed to call 911 with any severe reactions post vaccine: Marland Kitchen Difficulty breathing  . Swelling of face and throat  . A fast heartbeat  . A bad rash all over body  . Dizziness and weakness   Immunizations Administered    Name Date Dose VIS Date Route   Pfizer COVID-19 Vaccine 12/30/2019  9:50 AM 0.3 mL 06/09/2018 Intramuscular   Manufacturer: [employer], [employer]   Lot: [id number]   NDC: [id number]

## 2020-01-20 ENCOUNTER — Ambulatory Visit: Payer: BC Managed Care – PPO | Attending: Internal Medicine

## 2023-06-25 ENCOUNTER — Encounter: Payer: Self-pay | Admitting: *Deleted

## 2023-11-20 ENCOUNTER — Encounter (INDEPENDENT_AMBULATORY_CARE_PROVIDER_SITE_OTHER): Payer: Self-pay | Admitting: *Deleted

## 2023-12-26 ENCOUNTER — Encounter (INDEPENDENT_AMBULATORY_CARE_PROVIDER_SITE_OTHER): Payer: Self-pay | Admitting: *Deleted
